# Patient Record
Sex: Male | Born: 1980 | Race: Black or African American | Hispanic: No | Smoking: Never smoker
Health system: Southern US, Community
[De-identification: ages and names within clinical notes are randomized; demographics above are authoritative.]

---

## 2011-07-31 ENCOUNTER — Emergency Department: Payer: Self-pay | Admitting: Unknown Physician Specialty

## 2011-07-31 LAB — CBC
HCT: 45.2 % (ref 40.0–52.0)
HGB: 15.4 g/dL (ref 13.0–18.0)
MCHC: 33.9 g/dL (ref 32.0–36.0)
Platelet: 175 10*3/uL (ref 150–440)
RDW: 13.5 % (ref 11.5–14.5)
WBC: 3.5 10*3/uL — ABNORMAL LOW (ref 3.8–10.6)

## 2011-07-31 LAB — COMPREHENSIVE METABOLIC PANEL
Albumin: 4.3 g/dL (ref 3.4–5.0)
Alkaline Phosphatase: 82 U/L (ref 50–136)
Anion Gap: 10 (ref 7–16)
BUN: 10 mg/dL (ref 7–18)
Calcium, Total: 8.6 mg/dL (ref 8.5–10.1)
Chloride: 103 mmol/L (ref 98–107)
Co2: 26 mmol/L (ref 21–32)
Creatinine: 1.21 mg/dL (ref 0.60–1.30)
Glucose: 91 mg/dL (ref 65–99)
Potassium: 3.7 mmol/L (ref 3.5–5.1)
SGOT(AST): 25 U/L (ref 15–37)
SGPT (ALT): 21 U/L
Total Protein: 8.2 g/dL (ref 6.4–8.2)

## 2013-02-19 ENCOUNTER — Encounter (HOSPITAL_COMMUNITY): Payer: Self-pay | Admitting: Emergency Medicine

## 2013-02-19 ENCOUNTER — Emergency Department (HOSPITAL_COMMUNITY): Payer: Self-pay

## 2013-02-19 ENCOUNTER — Emergency Department (HOSPITAL_COMMUNITY)
Admission: EM | Admit: 2013-02-19 | Discharge: 2013-02-19 | Disposition: A | Discharge status: Home / Self Care | Payer: PRIVATE HEALTH INSURANCE | Attending: Emergency Medicine | Admitting: Emergency Medicine

## 2013-02-19 DIAGNOSIS — S93409A Sprain of unspecified ligament of unspecified ankle, initial encounter: Secondary | ICD-10-CM | POA: Insufficient documentation

## 2013-02-19 DIAGNOSIS — Y9302 Activity, running: Secondary | ICD-10-CM | POA: Insufficient documentation

## 2013-02-19 DIAGNOSIS — Y929 Unspecified place or not applicable: Secondary | ICD-10-CM | POA: Insufficient documentation

## 2013-02-19 DIAGNOSIS — S93402A Sprain of unspecified ligament of left ankle, initial encounter: Secondary | ICD-10-CM

## 2013-02-19 DIAGNOSIS — R296 Repeated falls: Secondary | ICD-10-CM | POA: Insufficient documentation

## 2013-02-19 MED ORDER — NAPROXEN 500 MG PO TABS: 500.0000 mg | ORAL_TABLET | Freq: Two times a day (BID) | ORAL | Status: AC

## 2013-02-19 MED ORDER — HYDROCODONE-ACETAMINOPHEN 5-325 MG PO TABS: 1.0000 | ORAL_TABLET | ORAL | Status: AC | PRN

## 2013-02-19 MED ORDER — OXYCODONE-ACETAMINOPHEN 5-325 MG PO TABS
2.0000 | ORAL_TABLET | Freq: Once | ORAL | Status: AC
  Administered 2013-02-19: 2 via ORAL
  Filled 2013-02-19: qty 2
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
## 2013-02-19 NOTE — ED Provider Notes (Signed)
Medical screening examination/treatment/procedure(s) were performed by non-physician practitioner and as supervising physician I was immediately available for consultation/collaboration.   Ryli Standlee, MD 02/19/13 0415 
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
## 2014-04-01 ENCOUNTER — Emergency Department (HOSPITAL_COMMUNITY)
Admission: EM | Admit: 2014-04-01 | Discharge: 2014-04-01 | Disposition: A | Discharge status: Home / Self Care | Payer: Self-pay | Attending: Emergency Medicine | Admitting: Emergency Medicine

## 2014-04-01 ENCOUNTER — Encounter (HOSPITAL_COMMUNITY): Payer: Self-pay | Admitting: *Deleted

## 2014-04-01 DIAGNOSIS — Z791 Long term (current) use of non-steroidal anti-inflammatories (NSAID): Secondary | ICD-10-CM | POA: Insufficient documentation

## 2014-04-01 DIAGNOSIS — J069 Acute upper respiratory infection, unspecified: Secondary | ICD-10-CM | POA: Insufficient documentation

## 2014-04-01 LAB — RAPID STREP SCREEN (MED CTR MEBANE ONLY): Streptococcus, Group A Screen (Direct): NEGATIVE

## 2014-04-01 MED ORDER — GUAIFENESIN-CODEINE 100-10 MG/5ML PO SYRP: 5.0000 mL | ORAL_SOLUTION | Freq: Three times a day (TID) | ORAL | Status: AC | PRN

## 2014-04-01 NOTE — ED Notes (Signed)
The pt is c/o a sore throat since Sunday night

## 2014-04-01 NOTE — ED Provider Notes (Signed)
CSN: [id number]     Arrival date & time 04/01/14  0025 History   First MD Initiated Contact with Patient 04/01/14 0048     Chief Complaint  Patient presents with  . Sore Throat     (Consider location/radiation/quality/duration/timing/severity/associated sxs/prior Treatment) HPI   Patient reports gradual onset of throat discomfort which he describes as a soreness sensation which has been ongoing for the past 3-4 days. Patient states symptoms get progressively worse. Complaining of increasing pain with talking or with swallowing. Reports some mild nasal congestion, minimal cough, feeling fevers and chills. He reports feeling nauseous but denies vomiting or diarrhea. He denies any severe headache, chest pain, trouble breathing, abdominal pain, or rash. Patient denies any recent sick contact.  History reviewed. No pertinent past medical history. History reviewed. No pertinent past surgical history. No family history on file. History  Substance Use Topics  . Smoking status: Never Smoker   . Smokeless tobacco: Not on file  . Alcohol Use: No    Review of Systems  Constitutional: Negative for fever.  HENT: Positive for sore throat and trouble swallowing. Negative for ear pain and voice change.   Respiratory: Negative for shortness of breath.   Cardiovascular: Negative for chest pain.  Gastrointestinal: Negative for abdominal pain.  Skin: Negative for rash.  Neurological: Negative for headaches.      Allergies  Review of patient's allergies indicates no known allergies.  Home Medications   Prior to Admission medications   Medication Sig Start Date End Date Taking? Authorizing Provider  HYDROcodone-acetaminophen (NORCO/VICODIN) 5-325 MG per tablet Take 1-2 tablets by mouth every 4 (four) hours as needed for pain. 02/19/13   Phill MutterPeter S Dammen, PA-C  naproxen (NAPROSYN) 500 MG tablet Take 1 tablet (500 mg total) by mouth 2 (two) times daily. 02/19/13   Phill MutterPeter S Dammen, PA-C  naproxen  sodium (ANAPROX) 220 MG tablet Take 220 mg by mouth 2 (two) times daily as needed (for pain).    Historical Provider, MD   BP 136/91 mmHg  Pulse 55  Temp(Src) 97.5 F (36.4 C)  Resp 20  Wt 203 lb 2 oz (92.137 kg)  SpO2 98% Physical Exam  Constitutional: He appears well-developed and well-nourished. No distress.  HENT:  Head: Atraumatic.  Right Ear: External ear normal.  Left Ear: External ear normal.  Nose: Nose normal.  Mouth/Throat: Oropharynx is clear and moist.  Eyes: Conjunctivae are normal.  Neck: Normal range of motion. Neck supple.  Cardiovascular: Normal rate and regular rhythm.   Pulmonary/Chest: Effort normal and breath sounds normal.  Abdominal: Soft. Bowel sounds are normal. There is no tenderness.  Lymphadenopathy:    He has no cervical adenopathy.  Neurological: He is alert.  Skin: No rash noted.  Psychiatric: He has a normal mood and affect.    ED Course  Procedures (including critical care time)  12:55 AM Patient here with syncopal throat. He has an unremarkable neurologic examination. He has normal voice and no evidence of trismus. He is afebrile. Mildly bradycardic but not symptomatic.  Rapid strep test obtain.    1:23 AM Strep is negative. Likely URI. We'll treat his symptoms.  Return precaution discussed.  Labs Review Labs Reviewed  RAPID STREP SCREEN    Imaging Review No results found.   EKG Interpretation None      MDM   Final diagnoses:  URI (upper respiratory infection)    BP 136/91 mmHg  Pulse 55  Temp(Src) 97.5 F (36.4 C)  Resp 20  Wt  203 lb 2 oz (92.137 kg)  SpO2 98%     Fayrene HelperBowie Shafiq Larch, PA-C 04/01/14 0124  Loren Raceravid Yelverton, MD 04/01/14 [phone]

## 2014-04-01 NOTE — Discharge Instructions (Signed)
Upper Respiratory Infection, Adult An upper respiratory infection (URI) is also sometimes known as the common cold. The upper respiratory tract includes the nose, sinuses, throat, trachea, and bronchi. Bronchi are the airways leading to the lungs. Most people improve within 1 week, but symptoms can last up to 2 weeks. A residual cough may last even longer.  CAUSES Many different viruses can infect the tissues lining the upper respiratory tract. The tissues become irritated and inflamed and often become very moist. Mucus production is also common. A cold is contagious. You can easily spread the virus to others by oral contact. This includes kissing, sharing a glass, coughing, or sneezing. Touching your mouth or nose and then touching a surface, which is then touched by another person, can also spread the virus. SYMPTOMS  Symptoms typically develop 1 to 3 days after you come in contact with a cold virus. Symptoms vary from person to person. They may include:  Runny nose.  Sneezing.  Nasal congestion.  Sinus irritation.  Sore throat.  Loss of voice (laryngitis).  Cough.  Fatigue.  Muscle aches.  Loss of appetite.  Headache.  Low-grade fever. DIAGNOSIS  You might diagnose your own cold based on familiar symptoms, since most people get a cold 2 to 3 times a year. Your caregiver can confirm this based on your exam. Most importantly, your caregiver can check that your symptoms are not due to another disease such as strep throat, sinusitis, pneumonia, asthma, or epiglottitis. Blood tests, throat tests, and X-rays are not necessary to diagnose a common cold, but they may sometimes be helpful in excluding other more serious diseases. Your caregiver will decide if any further tests are required. RISKS AND COMPLICATIONS  You may be at risk for a more severe case of the common cold if you smoke cigarettes, have chronic heart disease (such as heart failure) or lung disease (such as asthma), or if  you have a weakened immune system. The very young and very old are also at risk for more serious infections. Bacterial sinusitis, middle ear infections, and bacterial pneumonia can complicate the common cold. The common cold can worsen asthma and chronic obstructive pulmonary disease (COPD). Sometimes, these complications can require emergency medical care and may be life-threatening. PREVENTION  The best way to protect against getting a cold is to practice good hygiene. Avoid oral or hand contact with people with cold symptoms. Wash your hands often if contact occurs. There is no clear evidence that vitamin C, vitamin E, echinacea, or exercise reduces the chance of developing a cold. However, it is always recommended to get plenty of rest and practice good nutrition. TREATMENT  Treatment is directed at relieving symptoms. There is no cure. Antibiotics are not effective, because the infection is caused by a virus, not by bacteria. Treatment may include:  Increased fluid intake. Sports drinks offer valuable electrolytes, sugars, and fluids.  Breathing heated mist or steam (vaporizer or shower).  Eating chicken soup or other clear broths, and maintaining good nutrition.  Getting plenty of rest.  Using gargles or lozenges for comfort.  Controlling fevers with ibuprofen or acetaminophen as directed by your caregiver.  Increasing usage of your inhaler if you have asthma. Zinc gel and zinc lozenges, taken in the first 24 hours of the common cold, can shorten the duration and lessen the severity of symptoms. Pain medicines may help with fever, muscle aches, and throat pain. A variety of non-prescription medicines are available to treat congestion and runny nose. Your caregiver   can make recommendations and may suggest nasal or lung inhalers for other symptoms.  HOME CARE INSTRUCTIONS   Only take over-the-counter or prescription medicines for pain, discomfort, or fever as directed by your  caregiver.  Use a warm mist humidifier or inhale steam from a shower to increase air moisture. This may keep secretions moist and make it easier to breathe.  Drink enough water and fluids to keep your urine clear or pale yellow.  Rest as needed.  Return to work when your temperature has returned to normal or as your caregiver advises. You may need to stay home longer to avoid infecting others. You can also use a face mask and careful hand washing to prevent spread of the virus. SEEK MEDICAL CARE IF:   After the first few days, you feel you are getting worse rather than better.  You need your caregiver's advice about medicines to control symptoms.  You develop chills, worsening shortness of breath, or brown or red sputum. These may be signs of pneumonia.  You develop yellow or brown nasal discharge or pain in the face, especially when you bend forward. These may be signs of sinusitis.  You develop a fever, swollen neck glands, pain with swallowing, or white areas in the back of your throat. These may be signs of strep throat. SEEK IMMEDIATE MEDICAL CARE IF:   You have a fever.  You develop severe or persistent headache, ear pain, sinus pain, or chest pain.  You develop wheezing, a prolonged cough, cough up blood, or have a change in your usual mucus (if you have chronic lung disease).  You develop sore muscles or a stiff neck. Document Released: 10/10/2000 Document Revised: 07/09/2011 Document Reviewed: 07/22/2013 Exit[employer] Patient Information 2015 [employer]. This information is not intended to replace advice given to you by your health care provider. Make sure you discuss any questions you have with your health care provider.  

## 2014-04-02 LAB — CULTURE, GROUP A STREP

## 2014-08-18 ENCOUNTER — Emergency Department (HOSPITAL_COMMUNITY)
Admission: EM | Admit: 2014-08-18 | Discharge: 2014-08-18 | Disposition: A | Discharge status: Home / Self Care | Payer: Self-pay | Attending: Emergency Medicine | Admitting: Emergency Medicine

## 2014-08-18 ENCOUNTER — Encounter (HOSPITAL_COMMUNITY): Payer: Self-pay | Admitting: Emergency Medicine

## 2014-08-18 DIAGNOSIS — K098 Other cysts of oral region, not elsewhere classified: Secondary | ICD-10-CM | POA: Insufficient documentation

## 2014-08-18 DIAGNOSIS — Z791 Long term (current) use of non-steroidal anti-inflammatories (NSAID): Secondary | ICD-10-CM | POA: Insufficient documentation

## 2014-08-18 DIAGNOSIS — L720 Epidermal cyst: Secondary | ICD-10-CM

## 2014-08-18 NOTE — Discharge Instructions (Signed)
Please read and follow all provided instructions.  Your diagnoses today include:  1. Epidermoid cyst of face    Tests performed today include:  Vital signs. See below for your results today.   Medications prescribed:   None  Home care instructions:  Follow any educational materials contained in this packet.  Follow-up instructions: Please follow-up with your primary care provider as needed for further evaluation of your symptoms.  You may need to see a dermatologist for consideration for excision of the cyst.   Return instructions:   Please return to the Emergency Department if you experience worsening symptoms.   Please return if you have any other emergent concerns.  Additional Information:  Your vital signs today were: BP 135/90 mmHg   Pulse 56   Temp(Src) 98.1 F (36.7 C) (Oral)   Resp 16   SpO2 100% If your blood pressure (BP) was elevated above 135/85 this visit, please have this repeated by your doctor within one month. ---------------

## 2014-08-18 NOTE — ED Notes (Signed)
Per pt, states bump/cyst on forehead that appeared 3 weeks ago

## 2014-08-18 NOTE — ED Provider Notes (Signed)
CSN: [id number]     Arrival date & time 08/18/14  1443 History  This chart was scribed for non-physician practitioner, Renne CriglerJoshua Amarachukwu Lakatos, working with Linwood DibblesJon Knapp, MD by Richarda Overlieichard Holland, ED Scribe. This patient was seen in room WTR7/WTR7 and the patient's care was started at 3:27 PM.  Chief Complaint  Patient presents with  . Cyst   The history is provided by the patient. No language interpreter was used.   HPI Comments: Alex Watkins is a 34 y.o. male with no medical history who presents to the Emergency Department complaining of cyst on the middle of his forehead for the last 3 weeks. Pt states that the cyst has been increasing in size the last 2 weeks and reports an associated redness around the area at times. He states that the area is not tender currently and he denies any drainage as well. Pt reports no other skin issues at this time. He reports no modifying or exacerbating factors at this time.   History reviewed. No pertinent past medical history. History reviewed. No pertinent past surgical history. No family history on file. History  Substance Use Topics  . Smoking status: Never Smoker   . Smokeless tobacco: Not on file  . Alcohol Use: No    Review of Systems  Constitutional: Negative for fever.  Gastrointestinal: Negative for nausea and vomiting.  Skin: Positive for color change.       Cyst  Hematological: Negative for adenopathy.    Allergies  Review of patient's allergies indicates no known allergies.  Home Medications   Prior to Admission medications   Medication Sig Start Date End Date Taking? Authorizing Provider  guaiFENesin-codeine (CHERATUSSIN AC) 100-10 MG/5ML syrup Take 5 mLs by mouth 3 (three) times daily as needed (cough, or sore throat). 04/01/14   Fayrene HelperBowie Tran, PA-C  HYDROcodone-acetaminophen (NORCO/VICODIN) 5-325 MG per tablet Take 1-2 tablets by mouth every 4 (four) hours as needed for pain. 02/19/13   Ivonne AndrewPeter Dammen, PA-C  naproxen (NAPROSYN) 500 MG tablet  Take 1 tablet (500 mg total) by mouth 2 (two) times daily. 02/19/13   Ivonne AndrewPeter Dammen, PA-C  naproxen sodium (ANAPROX) 220 MG tablet Take 220 mg by mouth 2 (two) times daily as needed (for pain).    Historical Provider, MD   BP 135/90 mmHg  Pulse 56  Temp(Src) 98.1 F (36.7 C) (Oral)  Resp 16  SpO2 100% Physical Exam  Constitutional: He is oriented to person, place, and time. He appears well-developed and well-nourished.  HENT:  Head: Normocephalic and atraumatic.  Eyes: Conjunctivae are normal. Right eye exhibits no discharge. Left eye exhibits no discharge.  Neck: Normal range of motion. Neck supple. No tracheal deviation present.  Cardiovascular: Normal rate.   Pulmonary/Chest: Effort normal. No respiratory distress.  Abdominal: He exhibits no distension.  Neurological: He is alert and oriented to person, place, and time.  Skin: Skin is warm and dry.  Patient with 1 cm subcutaneous nodule in the middle of the forehead, multiple, nontender, nonfluctuant.  Psychiatric: He has a normal mood and affect. His behavior is normal.  Nursing note and vitals reviewed.   ED Course  Procedures   DIAGNOSTIC STUDIES: Oxygen Saturation is 100% on RA, normal by my interpretation.    COORDINATION OF CARE: 3:31 PM Discussed treatment plan with pt at bedside and pt agreed to plan.   Labs Review Labs Reviewed - No data to display  Imaging Review No results found.   EKG Interpretation None  Vital signs reviewed and are as follows: Filed Vitals:   08/18/14 1450  BP: 135/90  Pulse: 56  Temp: 98.1 F (36.7 C)  Resp: 16   Referral to dermatologist for consideration of excision. Cyst does not appear to be infected. No antibiotics or I&D needed at this time.  MDM   Final diagnoses:  Epidermoid cyst of face   Patient with likely cyst on forehead which is of cosmetic concern. No infection suspected. Appropriate referral given. No further intervention needed at this time.  I  personally performed the services described in this documentation, which was scribed in my presence. The recorded information has been reviewed and is accurate.      Renne Crigler, PA-C 08/18/14 1559  Linwood Dibbles, MD 08/18/14 [phone]

## 2015-04-11 IMAGING — CR DG ANKLE COMPLETE 3+V*L*
3 series · 3 of 3 positions shown · non-contrast
Comparison: None.

CLINICAL DATA: Twisted ankle. Diffuse pain.

EXAM:
LEFT ANKLE COMPLETE - 3+ VIEW

[x ankle ap left]
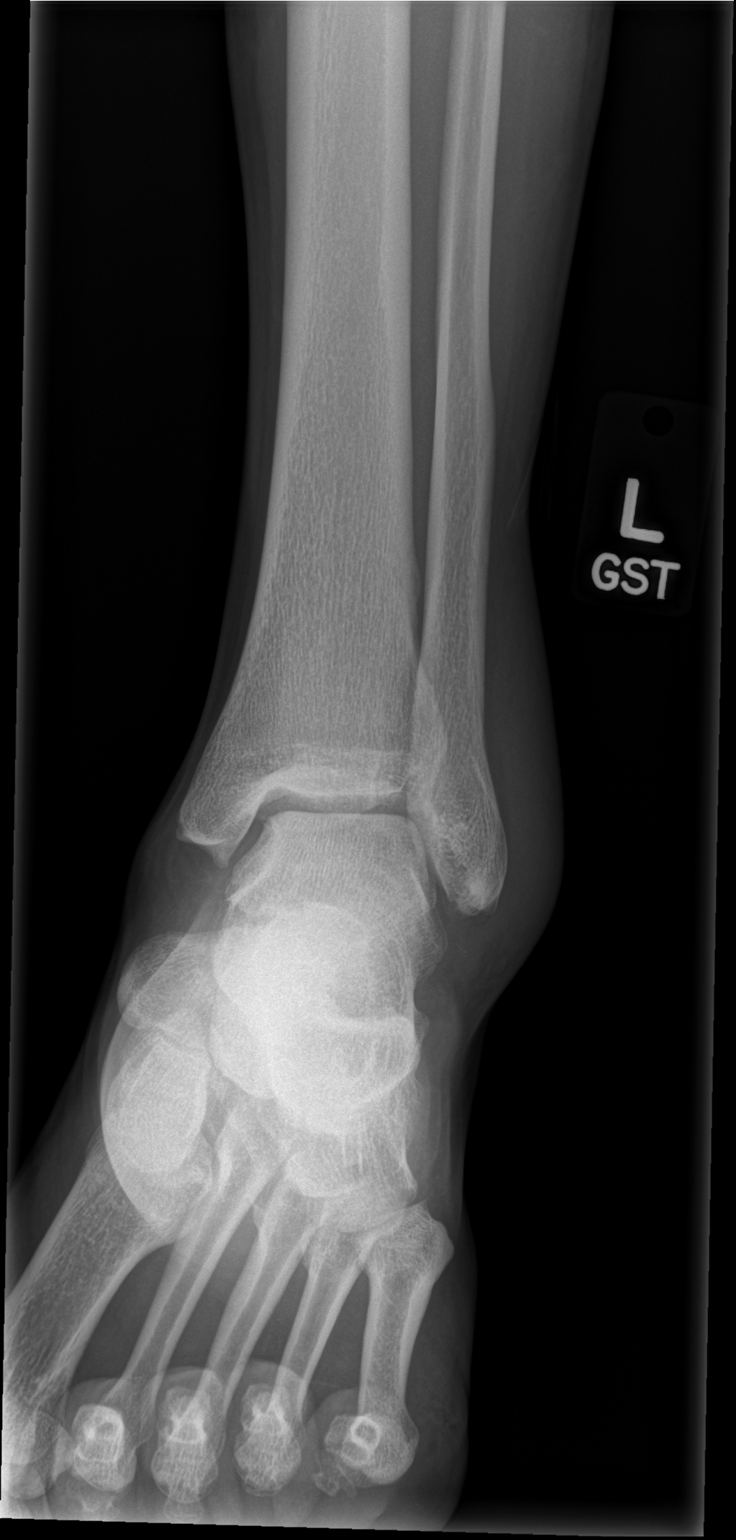

[x ankle obl left]
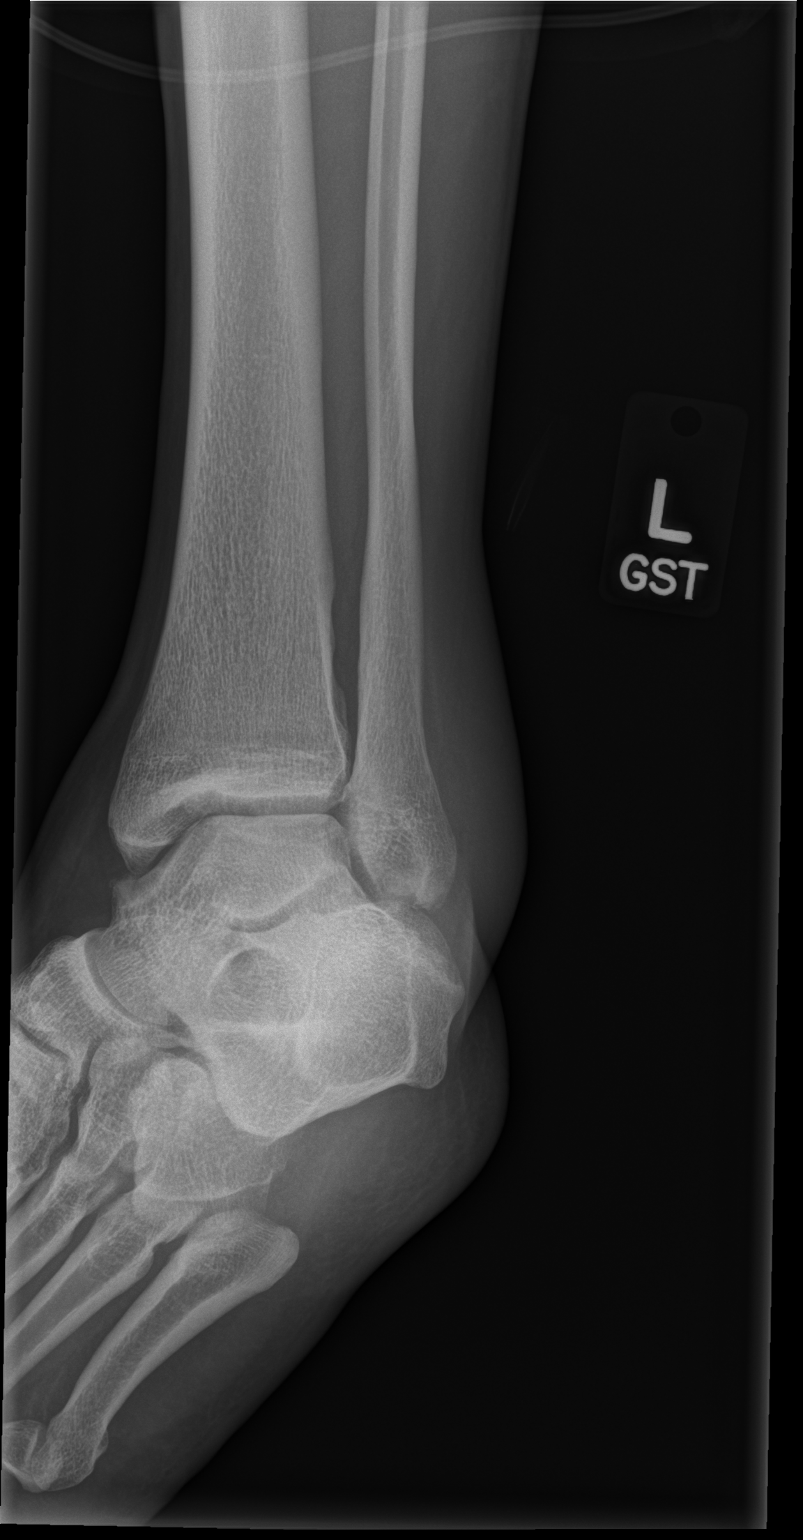

[x ankle lat left]
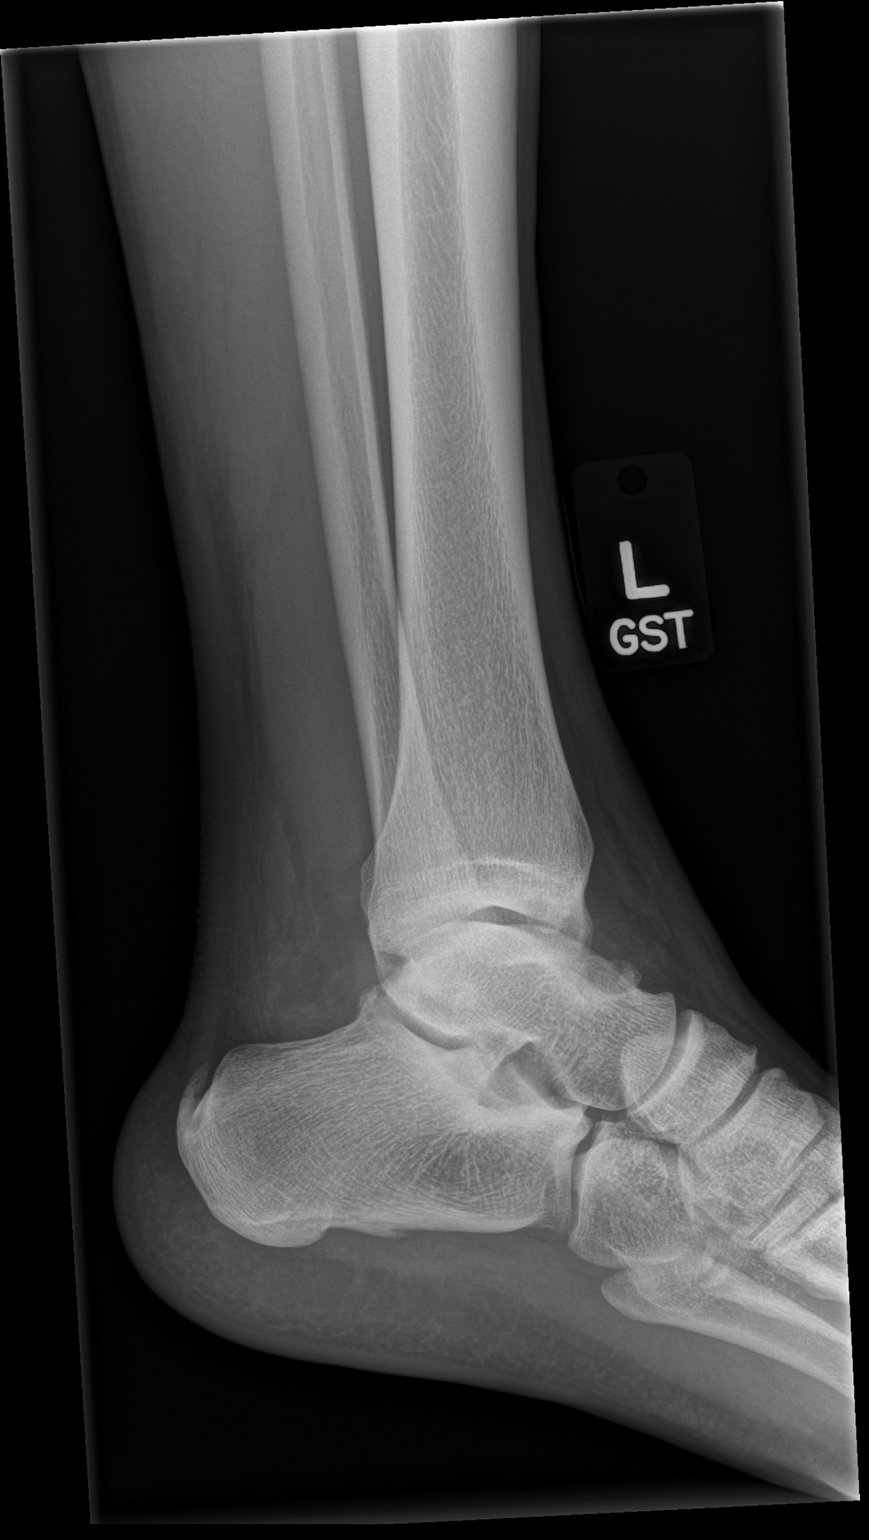

[3 of 3 positions shown; findings below may reference images not displayed]

FINDINGS: Lateral soft tissue swelling over the left ankle. No acute fracture
or subluxation is demonstrated. Hypertrophic degenerative changes
are present in the tibiotalar joint and talocalcaneal joints.
Achilles calcaneal spur.
IMPRESSION: Degenerative changes. Lateral soft tissue swelling. No displaced
fractures identified.

## 2017-06-12 ENCOUNTER — Encounter (HOSPITAL_COMMUNITY): Payer: Self-pay | Admitting: Emergency Medicine

## 2017-06-12 ENCOUNTER — Ambulatory Visit (HOSPITAL_COMMUNITY)
Admission: EM | Admit: 2017-06-12 | Discharge: 2017-06-12 | Disposition: A | Discharge status: Home / Self Care | Payer: Self-pay | Attending: Family Medicine | Admitting: Family Medicine

## 2017-06-12 ENCOUNTER — Other Ambulatory Visit: Payer: Self-pay

## 2017-06-12 DIAGNOSIS — J111 Influenza due to unidentified influenza virus with other respiratory manifestations: Secondary | ICD-10-CM

## 2017-06-12 DIAGNOSIS — R69 Illness, unspecified: Secondary | ICD-10-CM

## 2017-06-12 MED ORDER — HYDROCODONE-HOMATROPINE 5-1.5 MG/5ML PO SYRP: 5.0000 mL | ORAL_SOLUTION | Freq: Four times a day (QID) | ORAL | 0 refills | Status: AC | PRN

## 2017-06-12 MED ORDER — ACETAMINOPHEN 325 MG PO TABS
650.0000 mg | ORAL_TABLET | Freq: Once | ORAL | Status: AC
  Administered 2017-06-12: 650 mg via ORAL

## 2017-06-12 MED ORDER — ACETAMINOPHEN 325 MG PO TABS
ORAL_TABLET | ORAL | Status: AC
  Filled 2017-06-12: qty 2

## 2017-06-12 NOTE — Discharge Instructions (Signed)

## 2017-06-12 NOTE — ED Triage Notes (Addendum)
Pt reports URI symptoms two weeks ago that subsided after a week.  Last Friday he began with a dry cough that developed into body aches, chills, sweats, fever highest at 101.2 and a headache.  Pt has been taking Mucinex, Nyquil, Theraflu, and Excedrin.

## 2017-06-12 NOTE — ED Provider Notes (Addendum)
  [facility]   [id number] 06/12/17 Arrival Time: 1027  ASSESSMENT & PLAN:  1. Influenza-like illness     Meds ordered this encounter  Medications  . acetaminophen (TYLENOL) tablet 650 mg  . HYDROcodone-homatropine (HYCODAN) 5-1.5 MG/5ML syrup    Sig: Take 5 mLs by mouth every 6 (six) hours as needed for cough.    Dispense:  90 mL    Refill:  0   Cough medication sedation precautions. Discussed typical duration of symptoms. OTC symptom care as needed. Ensure adequate fluid intake and rest. May f/u with PCP or here as needed.  Reviewed expectations re: course of current medical issues. Questions answered. Outlined signs and symptoms indicating need for more acute intervention. Patient verbalized understanding. After Visit Summary given.   SUBJECTIVE: History from: patient.  Claude MangesJames Q Noyola is a 37 y.o. male who presents with complaint of nasal congestion, post-nasal drainage, and a persistent dry cough. Onset abrupt, approximately a few days ago. Overall fatigued with body aches. SOB: none. Wheezing: none. Fever: yes, subjective with chills. Overall normal PO intake without n/v. Sick contacts: no. OTC treatment: cough medication without much help.  Received flu shot this year: no.  Social History   Tobacco Use  Smoking Status Never Smoker  Smokeless Tobacco Never Used    ROS: As per HPI.   OBJECTIVE:  Vitals:   06/12/17 1126  BP: (!) 142/96  Pulse: 95  Temp: (!) 100.5 F (38.1 C)  TempSrc: Oral  SpO2: 99%    Febrile. General appearance: alert; appears fatigued HEENT: nasal congestion; clear runny nose; throat irritation secondary to post-nasal drainage Neck: supple without LAD Lungs: unlabored respirations, symmetrical air entry; cough: moderate; no respiratory distress Skin: warm and dry Psychological: alert and cooperative; normal mood and affect    No Known Allergies   Family History  Problem Relation Age of Onset  . Diabetes  Mother   . Kidney failure Mother   . Diabetes Father   . Diabetes Sister   . Diabetes Sister    Social History   Socioeconomic History  . Marital status: Single    Spouse name: Not on file  . Number of children: Not on file  . Years of education: Not on file  . Highest education level: Not on file  Social Needs  . Financial resource strain: Not on file  . Food insecurity - worry: Not on file  . Food insecurity - inability: Not on file  . Transportation needs - medical: Not on file  . Transportation needs - non-medical: Not on file  Occupational History  . Not on file  Tobacco Use  . Smoking status: Never Smoker  . Smokeless tobacco: Never Used  Substance and Sexual Activity  . Alcohol use: No  . Drug use: No  . Sexual activity: Not on file  Other Topics Concern  . Not on file  Social History Narrative  . Not on file           Mardella LaymanHagler, Odetta Forness, MD 06/12/17 1147    Mardella LaymanHagler, Fenix Ruppe, MD 06/12/17 1150

## 2018-07-09 ENCOUNTER — Ambulatory Visit: Payer: Self-pay | Admitting: Family Medicine

## 2023-12-20 ENCOUNTER — Emergency Department (HOSPITAL_COMMUNITY)
Admission: EM | Admit: 2023-12-20 | Discharge: 2023-12-20 | Disposition: A | Discharge status: Home / Self Care | Payer: Self-pay | Attending: Emergency Medicine | Admitting: Emergency Medicine

## 2023-12-20 ENCOUNTER — Other Ambulatory Visit: Payer: Self-pay

## 2023-12-20 DIAGNOSIS — E1165 Type 2 diabetes mellitus with hyperglycemia: Secondary | ICD-10-CM | POA: Insufficient documentation

## 2023-12-20 DIAGNOSIS — R739 Hyperglycemia, unspecified: Secondary | ICD-10-CM

## 2023-12-20 LAB — CBC WITH DIFFERENTIAL/PLATELET
Abs Immature Granulocytes: 0.01 K/uL (ref 0.00–0.07)
Basophils Absolute: 0 K/uL (ref 0.0–0.1)
Basophils Relative: 0 %
Eosinophils Absolute: 0.1 K/uL (ref 0.0–0.5)
Eosinophils Relative: 1 %
HCT: 46.1 % (ref 39.0–52.0)
Hemoglobin: 15.4 g/dL (ref 13.0–17.0)
Immature Granulocytes: 0 %
Lymphocytes Relative: 47 %
Lymphs Abs: 2 K/uL (ref 0.7–4.0)
MCH: 27.5 pg (ref 26.0–34.0)
MCHC: 33.4 g/dL (ref 30.0–36.0)
MCV: 82.5 fL (ref 80.0–100.0)
Monocytes Absolute: 0.4 K/uL (ref 0.1–1.0)
Monocytes Relative: 9 %
Neutro Abs: 1.9 K/uL (ref 1.7–7.7)
Neutrophils Relative %: 43 %
Platelets: 234 K/uL (ref 150–400)
RBC: 5.59 MIL/uL (ref 4.22–5.81)
RDW: 12.3 % (ref 11.5–15.5)
WBC: 4.4 K/uL (ref 4.0–10.5)
nRBC: 0 % (ref 0.0–0.2)

## 2023-12-20 LAB — BASIC METABOLIC PANEL WITH GFR
Anion gap: 11 (ref 5–15)
BUN: 19 mg/dL (ref 6–20)
CO2: 25 mmol/L (ref 22–32)
Calcium: 9.3 mg/dL (ref 8.9–10.3)
Chloride: 94 mmol/L — ABNORMAL LOW (ref 98–111)
Creatinine, Ser: 1.05 mg/dL (ref 0.61–1.24)
GFR, Estimated: >60 mL/min (ref >60)
Glucose, Bld: 386 mg/dL — ABNORMAL HIGH (ref 70–99)
Potassium: 4 mmol/L (ref 3.5–5.1)
Sodium: 130 mmol/L — ABNORMAL LOW (ref 135–145)

## 2023-12-20 LAB — CBG MONITORING, ED: Glucose-Capillary: 412 mg/dL — ABNORMAL HIGH (ref 70–99)

## 2023-12-20 MED ORDER — SODIUM CHLORIDE 0.9 % IV BOLUS
1000.0000 mL | Freq: Once | INTRAVENOUS | Status: AC
  Administered 2023-12-20: 1000 mL via INTRAVENOUS

## 2023-12-20 MED ORDER — METFORMIN HCL 500 MG PO TABS: 500.0000 mg | ORAL_TABLET | Freq: Two times a day (BID) | ORAL | 0 refills | Status: AC

## 2023-12-20 NOTE — ED Triage Notes (Signed)
 Pt here for hyperglycemia. Pt states he was getting a physical today and they checked his blood sugar and found it to be 551. Pt states he does not have a known hx of DM

## 2023-12-20 NOTE — ED Provider Notes (Signed)
 [place] EMERGENCY DEPARTMENT AT [facility] Provider Note  CSN: [id number] Arrival date & time: 12/20/23 1733  Chief Complaint(s) Hyperglycemia  HPI Alex Watkins is a 43 y.o. male he denies any significant past medical history presenting to the emergency department with high blood sugar.  Reports that he was getting a prework physical and blood sugar was checked and was in the 500s so referred to the ER.  Reports that over the past few months he has had some polyuria, polydipsia, mild fatigue.  No fevers or chills, chest pain, abdominal pain, painful urination, any other new symptoms.  Does have family history of diabetes extensively.  No shortness of breath.   Past Medical History No past medical history on file. There are no active problems to display for this patient.  Home Medication(s) Prior to Admission medications   Medication Sig Start Date End Date Taking? Authorizing Provider  metFORMIN  (GLUCOPHAGE ) 500 MG tablet Take 1 tablet (500 mg total) by mouth 2 (two) times daily with a meal. 12/20/23  Yes Margarit Minshall, Elsie CROME, MD  guaiFENesin -codeine  (CHERATUSSIN AC) 100-10 MG/5ML syrup Take 5 mLs by mouth 3 (three) times daily as needed (cough, or sore throat). 04/01/14   Nivia Colon, PA-C  HYDROcodone -acetaminophen  (NORCO/VICODIN) 5-325 MG per tablet Take 1-2 tablets by mouth every 4 (four) hours as needed for pain. 02/19/13   Gabe Coy, PA-C  HYDROcodone -homatropine (HYCODAN) 5-1.5 MG/5ML syrup Take 5 mLs by mouth every 6 (six) hours as needed for cough. 06/12/17   Rolinda Rogue, MD  naproxen  (NAPROSYN ) 500 MG tablet Take 1 tablet (500 mg total) by mouth 2 (two) times daily. 02/19/13   Dammen, Peter, PA-C  naproxen  sodium (ANAPROX ) 220 MG tablet Take 220 mg by mouth 2 (two) times daily as needed (for pain).    [provider]                                                                                                                                    Past  Surgical History No past surgical history on file. Family History Family History  Problem Relation Age of Onset   Diabetes Mother    Kidney failure Mother    Diabetes Father    Diabetes Sister    Diabetes Sister     Social History Social History   Tobacco Use   Smoking status: Never   Smokeless tobacco: Never  Vaping Use   Vaping status: Never Used  Substance Use Topics   Alcohol use: No   Drug use: No   Allergies Patient has no known allergies.  Review of Systems Review of Systems  All other systems reviewed and are negative.   Physical Exam Vital Signs  I have reviewed the triage vital signs BP (!) 152/108 (BP Location: Left Arm)   Pulse 67   Temp 98.1 F (36.7 C) (Oral)   Resp 18   SpO2 100%  Physical  Exam Vitals and nursing note reviewed.  Constitutional:      General: He is not in acute distress.    Appearance: Normal appearance.  HENT:     Mouth/Throat:     Mouth: Mucous membranes are moist.  Eyes:     Conjunctiva/sclera: Conjunctivae normal.  Cardiovascular:     Rate and Rhythm: Normal rate and regular rhythm.  Pulmonary:     Effort: Pulmonary effort is normal. No respiratory distress.     Breath sounds: Normal breath sounds.  Abdominal:     General: Abdomen is flat.     Palpations: Abdomen is soft.     Tenderness: There is no abdominal tenderness.  Musculoskeletal:     Right lower leg: No edema.     Left lower leg: No edema.  Skin:    General: Skin is warm and dry.     Capillary Refill: Capillary refill takes less than 2 seconds.  Neurological:     Mental Status: He is alert and oriented to person, place, and time. Mental status is at baseline.  Psychiatric:        Mood and Affect: Mood normal.        Behavior: Behavior normal.     ED Results and Treatments Labs (all labs ordered are listed, but only abnormal results are displayed) Labs Reviewed  BASIC METABOLIC PANEL WITH GFR - Abnormal; Notable for the following components:       Result Value   Sodium 130 (*)    Chloride 94 (*)    Glucose, Bld 386 (*)    All other components within normal limits  CBG MONITORING, ED - Abnormal; Notable for the following components:   Glucose-Capillary 412 (*)    All other components within normal limits  CBC WITH DIFFERENTIAL/PLATELET                                                                                                                          Radiology No results found.  Pertinent labs & imaging results that were available during my care of the patient were reviewed by me and considered in my medical decision making (see MDM for details).  Medications Ordered in ED Medications  sodium chloride  0.9 % bolus 1,000 mL (1,000 mLs Intravenous New Bag/Given 12/20/23 1844)  Procedures Procedures  (including critical care time)  Medical Decision Making / ED Course   MDM:  43 year old presenting to the emergency department with high blood sugar.  Point-of-care blood sugar in the emergency department elevated to 412.  Does meet criteria for diabetes on random blood glucose testing.  Will check basic labs, doubt DKA or other acute dangerous process requiring hospitalization.  Symptoms seem fairly chronic.  Very consistent with type 2 diabetes.  Likely started on metformin .  Will check some basic labs.  Will give fluids.  Will need to follow-up with his primary doctor.  Reports he has not seen primary doctor for some time.  Clinical Course as of 12/20/23 2029  Fri Dec 20, 2023  2028 Labs reassuring, mild pseudohyponatremia.  Received IV fluids for hyperglycemia.  Will start on metformin .  No evidence of DKA or other dangerous process.  Recommended follow-up with primary physician. Will discharge patient to home. All questions answered. Patient comfortable with plan of discharge. Return  precautions discussed with patient and specified on the after visit summary.  [WS]    Clinical Course User Index [WS] Francesca Elsie CROME, MD     Additional history obtained: -Additional history obtained from family    Lab Tests: -I ordered, reviewed, and interpreted labs.   The pertinent results include:   Labs Reviewed  BASIC METABOLIC PANEL WITH GFR - Abnormal; Notable for the following components:      Result Value   Sodium 130 (*)    Chloride 94 (*)    Glucose, Bld 386 (*)    All other components within normal limits  CBG MONITORING, ED - Abnormal; Notable for the following components:   Glucose-Capillary 412 (*)    All other components within normal limits  CBC WITH DIFFERENTIAL/PLATELET    Notable for hyperglycemia    Medicines ordered and prescription drug management: Meds ordered this encounter  Medications   sodium chloride  0.9 % bolus 1,000 mL   metFORMIN  (GLUCOPHAGE ) 500 MG tablet    Sig: Take 1 tablet (500 mg total) by mouth 2 (two) times daily with a meal.    Dispense:  120 tablet    Refill:  0    -I have reviewed the patients home medicines and have made adjustments as needed   Reevaluation: After the interventions noted above, I reevaluated the patient and found that their symptoms have improved  Co morbidities that complicate the patient evaluation No past medical history on file.    Dispostion: Disposition decision including need for hospitalization was considered, and patient discharged from emergency department.    Final Clinical Impression(s) / ED Diagnoses Final diagnoses:  Hyperglycemia  Type 2 diabetes mellitus with hyperglycemia, without long-term current use of insulin (HCC)     This chart was dictated using voice recognition software.  Despite best efforts to proofread,  errors can occur which can change the documentation meaning.    Francesca Elsie CROME, MD 12/20/23 2029

## 2023-12-20 NOTE — Discharge Instructions (Addendum)
 We evaluated you for your high blood sugar.  Your blood testing was otherwise reassuring.  Based on your blood sugar level, you meet criteria to be diagnosed with diabetes.  We have started you on a diabetes medication called metformin .  Please take this twice daily with meals.  You do not need to closely monitor your blood sugar while you are taking this medication.  However, it is very important to follow-up with your primary doctor for further care as soon as possible.  If you do not want to follow-up with your prior primary doctor, you can call the phone number on your discharge paperwork to set up care with a new primary doctor.  You may need additional medication if your blood sugar remains elevated while you are taking metformin .  Please try to eat healthy, we have attached information on the best type of foods to eat. It is important to avoid sugars and simple carbohydrates like white bread.  This can make your blood sugar more elevated.  If you have any other new or worsening symptoms like difficulty breathing, chest pain, lightness or dizziness, fainting, painful urination, abdominal pain, nausea or vomiting, or any other new symptoms, please return to the emergency department for reassessment.
